# Patient Record
Sex: Female | Born: 1995 | Hispanic: Yes | Marital: Single | Smoking: Never smoker
Health system: Southern US, Community
[De-identification: ages and names within clinical notes are randomized; demographics above are authoritative.]

---
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
## 2018-04-17 ENCOUNTER — Emergency Department
Admission: EM | Admit: 2018-04-17 | Discharge: 2018-04-17 | Disposition: A | Discharge status: Home / Self Care | Payer: Medicaid - Out of State | Attending: Emergency Medicine | Admitting: Emergency Medicine

## 2018-04-17 ENCOUNTER — Emergency Department: Payer: Medicaid - Out of State

## 2018-04-17 ENCOUNTER — Other Ambulatory Visit: Payer: Self-pay

## 2018-04-17 DIAGNOSIS — R000 Tachycardia, unspecified: Secondary | ICD-10-CM

## 2018-04-17 LAB — CBC WITH DIFFERENTIAL/PLATELET
Basophils Absolute: 0 10*3/uL (ref 0–0.1)
Basophils Relative: 1 %
EOS PCT: 1 %
Eosinophils Absolute: 0 10*3/uL (ref 0–0.7)
HCT: 40 % (ref 35.0–47.0)
Hemoglobin: 13.7 g/dL (ref 12.0–16.0)
LYMPHS ABS: 1.8 10*3/uL (ref 1.0–3.6)
LYMPHS PCT: 20 %
MCH: 31.3 pg (ref 26.0–34.0)
MCHC: 34.2 g/dL (ref 32.0–36.0)
MCV: 91.6 fL (ref 80.0–100.0)
MONO ABS: 0.4 10*3/uL (ref 0.2–0.9)
Monocytes Relative: 4 %
Neutro Abs: 6.7 10*3/uL — ABNORMAL HIGH (ref 1.4–6.5)
Neutrophils Relative %: 74 %
PLATELETS: 290 10*3/uL (ref 150–440)
RBC: 4.37 MIL/uL (ref 3.80–5.20)
RDW: 13.6 % (ref 11.5–14.5)
WBC: 9 10*3/uL (ref 3.6–11.0)

## 2018-04-17 LAB — BASIC METABOLIC PANEL
Anion gap: 8 (ref 5–15)
BUN: 9 mg/dL (ref 6–20)
CALCIUM: 9.2 mg/dL (ref 8.9–10.3)
CO2: 23 mmol/L (ref 22–32)
CREATININE: 0.8 mg/dL (ref 0.44–1.00)
Chloride: 106 mmol/L (ref 98–111)
GFR calc Af Amer: >60 mL/min (ref >60)
Glucose, Bld: 94 mg/dL (ref 70–99)
Potassium: 3.3 mmol/L — ABNORMAL LOW (ref 3.5–5.1)
SODIUM: 137 mmol/L (ref 135–145)

## 2018-04-17 LAB — TSH: TSH: 0.949 u[IU]/mL (ref 0.350–4.500)

## 2018-04-17 LAB — FIBRIN DERIVATIVES D-DIMER (ARMC ONLY): Fibrin derivatives D-dimer (ARMC): 158.28 ng/mL (FEU) (ref 0.00–499.00)

## 2018-04-17 MED ORDER — METOPROLOL TARTRATE 25 MG PO TABS: 25.0000 mg | ORAL_TABLET | Freq: Two times a day (BID) | ORAL | 0 refills | Status: AC

## 2018-04-17 NOTE — ED Provider Notes (Signed)
[facility] Emergency Department Provider Note       Time seen: ----------------------------------------- 2:48 PM on 04/17/2018 -----------------------------------------   I have reviewed the triage vital signs and the nursing notes.  HISTORY   Chief Complaint Tachycardia    HPI Alicia Mckinney is a 22 y.o. female with no significant past medical history who presents to the ED for tachycardia.  Patient has had intermittent dizziness and shortness of breath for several days.  Patient states her heart starts racing from time to time.  She states she has these symptoms worse when she is very hot or outdoors.  She was sent here for cardiology referral.  She denies recent illness or other complaints.  She is currently not taking any medications.  No past medical history on file.  There are no active problems to display for this patient.   No past surgical history on file.  Allergies Patient has no known allergies.  Social History Social History   Tobacco Use  . Smoking status: Never Smoker  . Smokeless tobacco: Never Used  Substance Use Topics  . Alcohol use: No  . Drug use: Not on file   Review of Systems Constitutional: Negative for fever. Cardiovascular: Positive for palpitations Respiratory: Negative for shortness of breath. Gastrointestinal: Negative for abdominal pain, vomiting and diarrhea. Musculoskeletal: Negative for back pain. Skin: Negative for rash. Neurological: Negative for headaches, focal weakness or numbness.  All systems negative/normal/unremarkable except as stated in the HPI  ____________________________________________   PHYSICAL EXAM:  VITAL SIGNS: ED Triage Vitals [04/17/18 1259]  Enc Vitals Group     BP 119/83     Pulse Rate 82     Resp 16     Temp 99.1 F (37.3 C)     Temp Source Oral     SpO2 100 %     Weight 100 lb (45.4 kg)     Height 5\' 3"  (1.6 m)     Head Circumference      Peak Flow      Pain  Score 0     Pain Loc      Pain Edu?      Excl. in GC?    Constitutional: Alert and oriented. Well appearing and in no distress. Eyes: Conjunctivae are normal. Normal extraocular movements. ENT   Head: Normocephalic and atraumatic.   Nose: No congestion/rhinnorhea.   Mouth/Throat: Mucous membranes are moist.   Neck: No stridor. Cardiovascular: Normal rate, regular rhythm. No murmurs, rubs, or gallops. Respiratory: Normal respiratory effort without tachypnea nor retractions. Breath sounds are clear and equal bilaterally. No wheezes/rales/rhonchi. Gastrointestinal: Soft and nontender. Normal bowel sounds Musculoskeletal: Nontender with normal range of motion in extremities. No lower extremity tenderness nor edema. Neurologic:  Normal speech and language. No gross focal neurologic deficits are appreciated.  Skin:  Skin is warm, dry and intact. No rash noted. Psychiatric: Mood and affect are normal. Speech and behavior are normal.  ____________________________________________  EKG: Interpreted by me.  Sinus tachycardia with a rate of 108 bpm, nonspecific T wave abnormalities, normal QT  ____________________________________________  ED COURSE:  As part of my medical decision making, I reviewed the following data within the electronic MEDICAL RECORD NUMBER History obtained from family if available, nursing notes, old chart and ekg, as well as notes from prior ED visits. Patient presented for tachycardia and lightheadedness, we will assess with labs and imaging as indicated at this time.   Procedures ____________________________________________   LABS (pertinent positives/negatives)  Labs Reviewed  BASIC METABOLIC PANEL - Abnormal; Notable for the following components:      Result Value   Potassium 3.3 (*)    All other components within normal limits  CBC WITH DIFFERENTIAL/PLATELET - Abnormal; Notable for the following components:   Neutro Abs 6.7 (*)    All other components  within normal limits  FIBRIN DERIVATIVES D-DIMER ([facility] ONLY)  TSH  POC URINE PREG, ED    RADIOLOGY Images were viewed by me  Chest x-ray is normal  ____________________________________________  DIFFERENTIAL DIAGNOSIS   Tachycardia, pots, anxiety, anemia, asthma  FINAL ASSESSMENT AND PLAN  Tachycardia   Plan: The patient had presented for fast heartbeat with some recent dizziness. Patient's labs do not reveal any acute process. Patient's imaging was unremarkable.  I will start her on low-dose metoprolol and refer her to cardiology for outpatient follow-up.   Ulice Dash, MD   Note: This note was generated in part or whole with voice recognition software. Voice recognition is usually quite accurate but there are transcription errors that can and very often do occur. I apologize for any typographical errors that were not detected and corrected.     Emily Filbert, MD 04/17/18 1450

## 2018-04-17 NOTE — ED Notes (Signed)
Pt unable to provide urine sample.

## 2018-04-17 NOTE — ED Triage Notes (Signed)
Pt arrived via POV with friend from [employer], Pt has been complaining of dizziness and shortness of breath and lightheadedness for several days.  Pt states she has been walking to class and states the heat has been bothering her sxs. Pt states she has these sxs at times when she is indoors but not usually odors.   Pt sent here for Cardiology referral. Pt was discussed with Dr. Scotty CourtStafford and new orders received for standard cardiac labs and CXR along with TSH and D-dimer.

## 2018-04-17 NOTE — ED Notes (Signed)
Pt denies dizziness, N/V/D . Pt denies any pain

## 2018-06-17 ENCOUNTER — Ambulatory Visit: Payer: Medicaid - Out of State | Admitting: Cardiovascular Disease

## 2019-07-04 IMAGING — CR DG CHEST 2V
2 series · 2 of 2 positions shown · non-contrast
Comparison: None.

CLINICAL DATA: Dizziness tachycardia

EXAM:
CHEST - 2 VIEW

[chest pa]
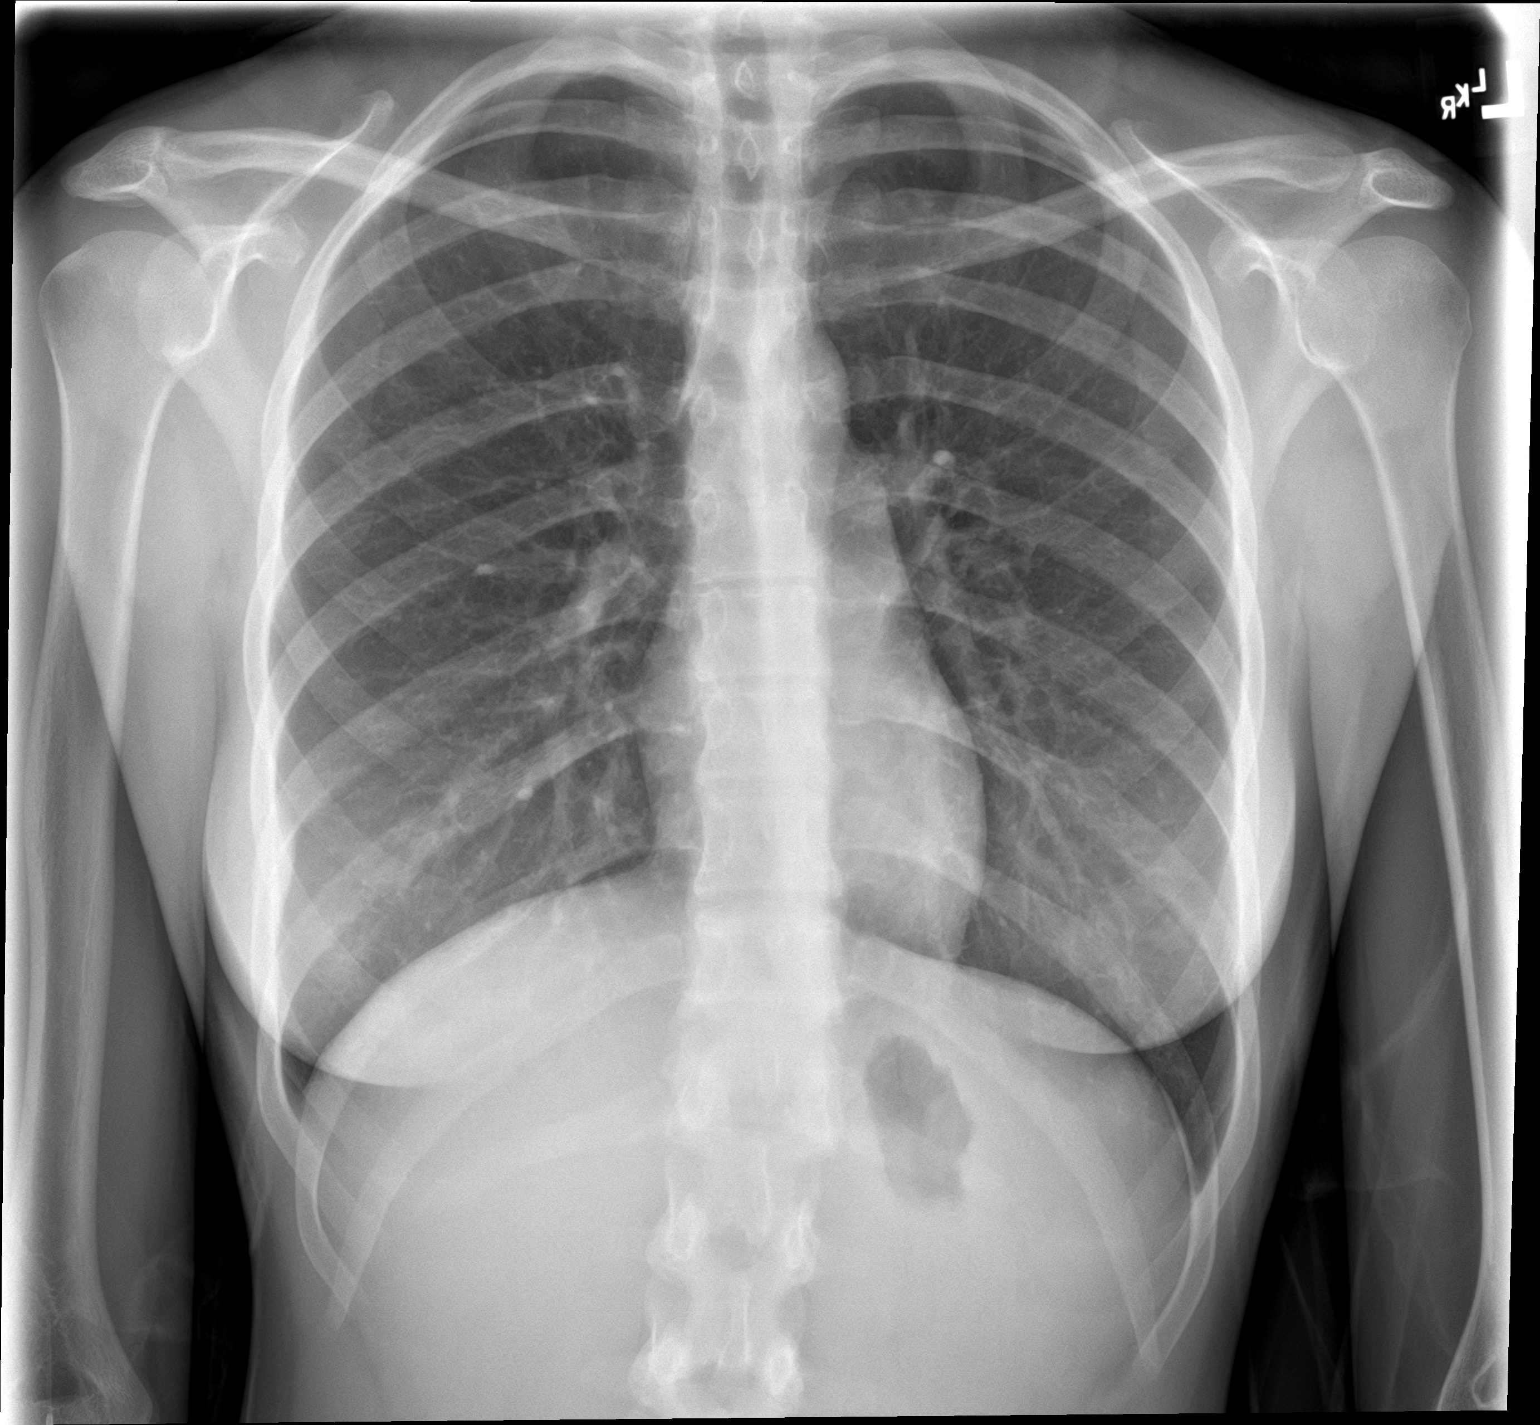

[chest lat]
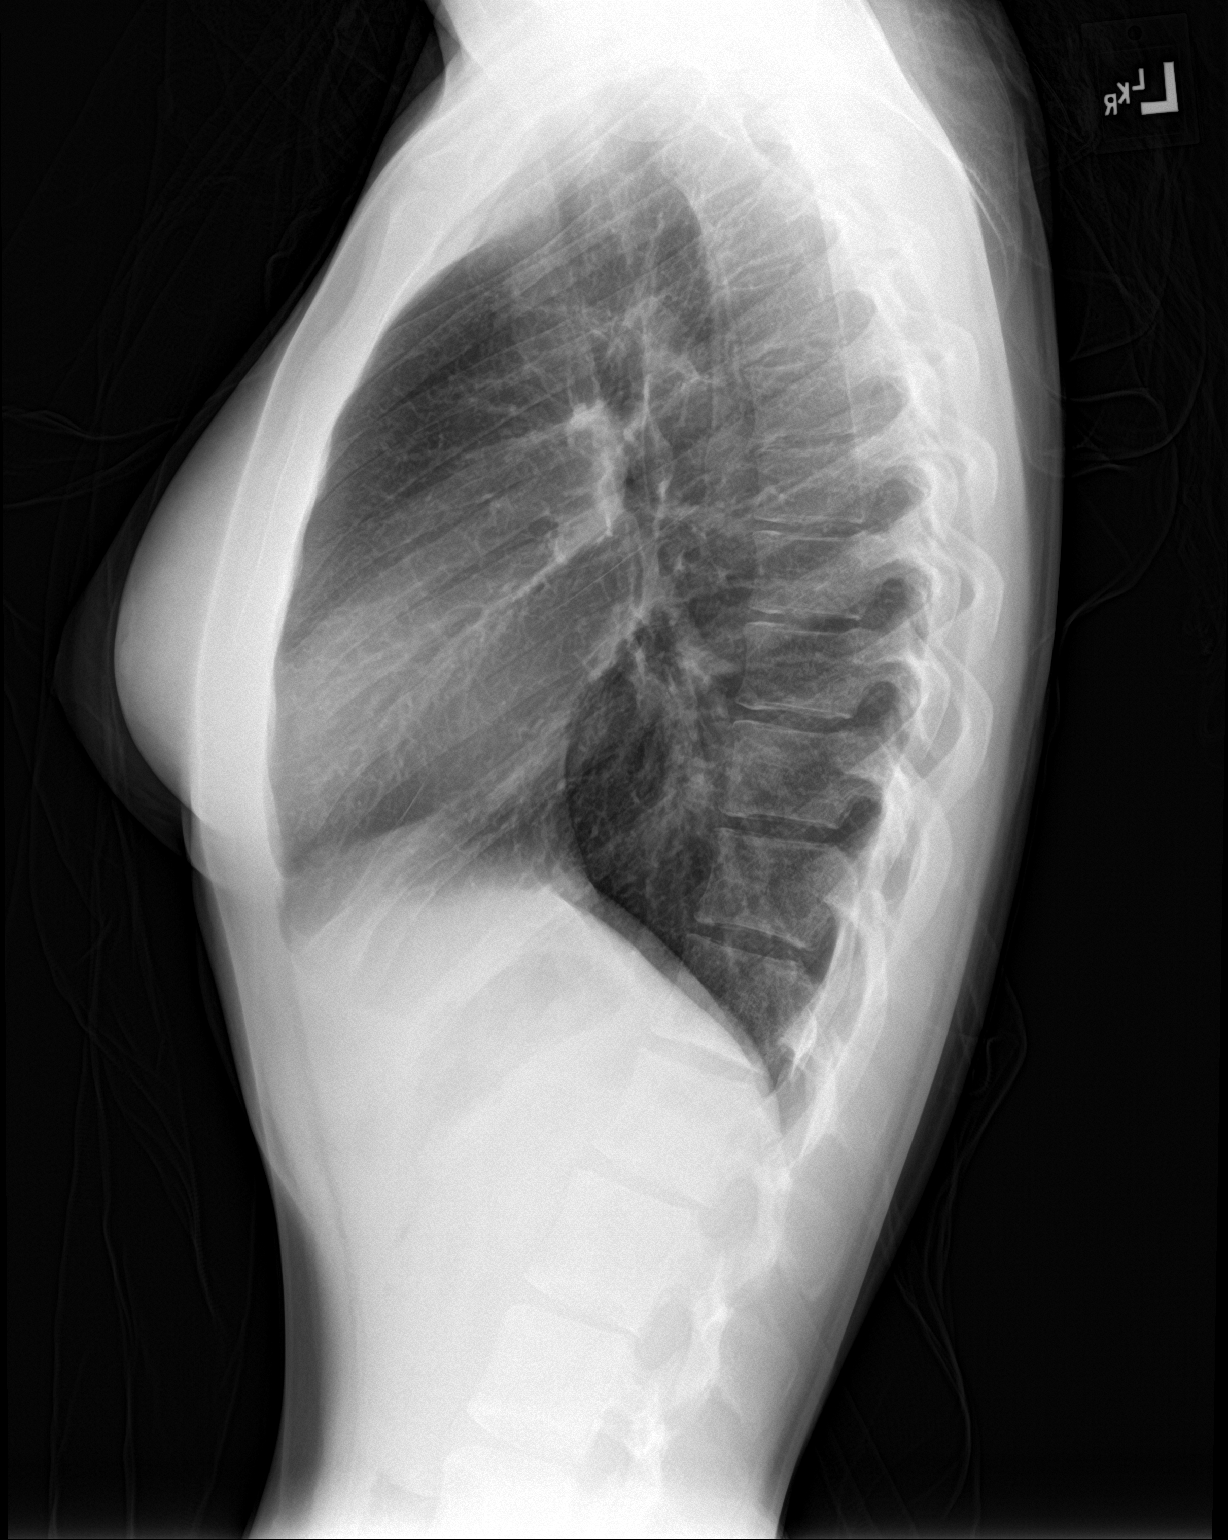

[2 of 2 positions shown; findings below may reference images not displayed]

FINDINGS: The heart size and mediastinal contours are within normal limits.
Both lungs are clear. The visualized skeletal structures are
unremarkable.
IMPRESSION: No active cardiopulmonary disease.
